# Patient Record
Sex: Male | Born: 1985 | Marital: Single | Smoking: Never smoker
Health system: Southern US, Community
[De-identification: ages and names within clinical notes are randomized; demographics above are authoritative.]

## PROBLEM LIST (undated history)

## (undated) DIAGNOSIS — M542 Cervicalgia: Secondary | ICD-10-CM

## (undated) DIAGNOSIS — L409 Psoriasis, unspecified: Secondary | ICD-10-CM

## (undated) HISTORY — DX: Psoriasis, unspecified: L40.9

## (undated) HISTORY — DX: Cervicalgia: M54.2

---

## 2018-03-11 ENCOUNTER — Ambulatory Visit (INDEPENDENT_AMBULATORY_CARE_PROVIDER_SITE_OTHER): Payer: Self-pay

## 2018-03-11 ENCOUNTER — Encounter: Payer: Self-pay | Admitting: Osteopathic Medicine

## 2018-03-11 ENCOUNTER — Ambulatory Visit (INDEPENDENT_AMBULATORY_CARE_PROVIDER_SITE_OTHER): Payer: Self-pay | Admitting: Osteopathic Medicine

## 2018-03-11 ENCOUNTER — Encounter (INDEPENDENT_AMBULATORY_CARE_PROVIDER_SITE_OTHER): Payer: Self-pay

## 2018-03-11 VITALS — BP 115/77 | HR 61 | Temp 98.1°F | Ht 72.0 in | Wt 161.4 lb

## 2018-03-11 DIAGNOSIS — M542 Cervicalgia: Secondary | ICD-10-CM

## 2018-03-11 DIAGNOSIS — L409 Psoriasis, unspecified: Secondary | ICD-10-CM

## 2018-03-11 DIAGNOSIS — Z8342 Family history of familial hypercholesterolemia: Secondary | ICD-10-CM

## 2018-03-11 DIAGNOSIS — Z0000 Encounter for general adult medical examination without abnormal findings: Secondary | ICD-10-CM

## 2018-03-11 HISTORY — DX: Psoriasis, unspecified: L40.9

## 2018-03-11 HISTORY — DX: Cervicalgia: M54.2

## 2018-03-11 MED ORDER — CLOBETASOL PROPIONATE 0.05 % EX SHAM: 1.0000 "application " | MEDICATED_SHAMPOO | CUTANEOUS | 3 refills | Status: AC

## 2018-03-11 NOTE — Progress Notes (Signed)
HPI: Jose Combs is a 32 y.o. male who  has no past medical Lurline Idolhistory on file.  [facility] today, 03/11/18,  for chief complaint of: New to establish  Pleasant patient here to establish care. I see his girlfriend as well. He works as a Education administratorpainter. He requests general check-up   Patient here for annual physical / wellness exam.  See preventive care reviewed as below.  Recent labs reviewed in detail with the patient.   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 Has XR of lower back awhile ago but never of neck      Past medical, surgical, social and family history reviewed:  Patient Active Problem List   Diagnosis Date Noted  . 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 Family history of high cholesterol 03/11/2018  . Neck pain 03/11/2018   History reviewed. No pertinent surgical history.  Social History   Tobacco Use  . Smoking status: Never Smoker  . Smokeless tobacco: Never Used  Substance Use Topics  . Alcohol use: Yes    Comment: 2 drinks/wk    Family History  Problem Relation Age of Onset  . Heart attack Maternal Grandmother   . Heart attack Maternal Grandfather      Current medication list and allergy/intolerance information reviewed:    No current outpatient medications on file.   No current facility-administered medications for this visit.     No Known Allergies    Review of Systems:  Constitutional:  No  fever, no chills, No recent illness, No unintentional weight changes. No significant fatigue.   HEENT: No  headache, no vision change, no hearing change, No sore throat, No  sinus pressure  Cardiac: No  chest pain, No  pressure, No palpitations, No   Orthopnea  Respiratory:  No  shortness of breath. No  Cough  Gastrointestinal: No  abdominal pain, No  nausea, No  vomiting,  No  blood in stool, No  diarrhea, No  constipation   Musculoskeletal: No new myalgia/arthralgia, +back and neck pain issues   Skin: +Rash, No other wounds/concerning lesions  Genitourinary: No  incontinence, No  abnormal genital bleeding, No abnormal genital discharge  Hem/Onc: No  easy bruising/bleeding, No  abnormal lymph node  Endocrine: No cold intolerance,  No heat intolerance. No polyuria/polydipsia/polyphagia   Neurologic: No  weakness, No  dizziness  Psychiatric: No  concerns with depression, No  concerns with anxiety, No sleep problems, No mood problems  Exam:  BP 115/77 (BP Location: Left Arm, Patient Position: Sitting, Cuff Size: Normal)   Pulse 61   Temp 98.1 F (36.7 C) (Oral)   Ht 6' (1.829 m)   Wt 161 lb 6.4 oz (73.2 kg)   BMI 21.89 kg/m   Constitutional: VS see above. General Appearance: alert, well-developed, well-nourished, NAD  Eyes: Normal lids and conjunctive, non-icteric sclera  Ears, Nose, Mouth, Throat: MMM, Normal external inspection ears/nares/mouth/lips/gums. TM normal bilaterally. Pharynx/tonsils no erythema, no exudate. Nasal mucosa normal.   Neck: No masses, trachea midline. No thyroid enlargement. No tenderness/mass appreciated. No lymphadenopathy  Respiratory: Normal respiratory effort. no wheeze, no rhonchi, no rales  Cardiovascular: S1/S2 normal, no murmur, no rub/gallop auscultated. RRR. No lower extremity edema.  Gastrointestinal: Nontender, no masses. No hepatomegaly, no splenomegaly. No hernia appreciated. Bowel sounds normal. Rectal exam deferred.   Musculoskeletal: Gait normal. No clubbing/cyanosis of digits.   Neurological: Normal balance/coordination. No tremor. No cranial nerve deficit on limited exam. Motor and sensation intact and symmetric. Cerebellar reflexes intact.   Skin: warm, dry, intact. No  rash/ulcer. No concerning nevi or subq nodules on limited exam.    Psychiatric: Normal judgment/insight. Normal mood and affect. Oriented x3.      ASSESSMENT/PLAN:   Annual physical exam - Plan: CBC, COMPLETE METABOLIC PANEL WITH GFR, Lipid panel, HIV antibody  Family history of high cholesterol - Plan: Lipid panel  Scalp psoriasis  Neck pain - Plan: DG Cervical Spine 2 or 3 views   Meds ordered this encounter  Medications  . Clobetasol Propionate 0.05 % shampoo    Sig: Apply 1 application topically 2 (two) times a week.    Dispense:  118 mL    Refill:  3   Orders Placed This Encounter  Procedures  . DG Cervical Spine 2 or 3 views  . CBC  . COMPLETE METABOLIC PANEL WITH GFR  . Lipid panel  . HIV antibody    MALE PREVENTIVE CARE  updated 03/11/18  ANNUAL SCREENING/COUNSELING  Any changes to health in the past year? no  Diet/Exercise - HEALTHY HABITS DISCUSSED TO DECREASE CV RISK Social History   Tobacco Use  Smoking Status Never Smoker  Smokeless Tobacco Never Used   Social History   Substance and Sexual Activity  Alcohol Use Yes   Comment: 2 drinks/wk   Depression screen [facility] 2/9 03/11/2018  Decreased Interest 0  Down, Depressed, Hopeless 0  PHQ - 2 Score 0  Altered sleeping 0  Tired, decreased energy 0  Change in appetite 0  Feeling bad or failure about yourself  0  Trouble concentrating 1  Moving slowly or fidgety/restless 0  Suicidal thoughts 0  PHQ-9 Score 1  Difficult doing work/chores Not difficult at all    SEXUAL/REPRODUCTIVE HEALTH  Sexually active in the past year? - Yes with male.  STI testing needed/desired today? - no  Any concerns with testosterone/libido? - no  INFECTIOUS DISEASE SCREENING  HIV - needs  GC/CT - does not need  HepC - does not need  TB - does not need  CANCER SCREENING  Lung - USPSTF: 55-80yo w/ 30 py hx unless quit w/in 37yr - does not need  Colon - does not need  Prostate - does not  need  OTHER DISEASE SCREENING  Lipid - needs  DM2 - needs  AAA - 65-75yo ever smoked: does not need  Osteoporosis - men 32yo+ - does not need  ADULT VACCINATION  Influenza - annual vaccine recommended  Td - booster every 10 years   Zoster - Shingrix recommended 81+ years old  PCV13 - was not indicated  PPSV23 - was not indicated  There is no immunization history on file for this patient.  OTHER  Fall - exercise and Vit D age 53+ - does not need  Consider ASA - age 74-59 - does not need      Patient Instructions  Try for scalp:   T-Sal shampoo (Salicylic acid)   Tea Tree Oil   Rx steroid shampoo as directed      Visit summary with medication list and pertinent instructions was printed for patient to review. All questions at time of visit were answered - patient instructed to contact office with any additional concerns. ER/RTC  precautions were reviewed with the patient.   Follow-up plan: Return in about 1 year (around 03/12/2019) for annual check-up, sooner if needed .    Please note: voice recognition software was used to produce this document, and typos may escape review. Please contact Dr. Lyn Hollingshead for any needed clarifications.

## 2018-03-11 NOTE — Patient Instructions (Addendum)
Try for scalp:   T-Sal shampoo (Salicylic acid)   Tea Tree Oil   Rx steroid shampoo as directed

## 2018-03-12 LAB — LIPID PANEL
CHOL/HDL RATIO: 3.3 (calc) (ref <5.0)
Cholesterol: 169 mg/dL (ref <200)
HDL: 51 mg/dL (ref >40)
LDL CHOLESTEROL (CALC): 103 mg/dL — AB
Non-HDL Cholesterol (Calc): 118 mg/dL (calc) (ref <130)
Triglycerides: 68 mg/dL (ref <150)

## 2018-03-12 LAB — COMPLETE METABOLIC PANEL WITH GFR
AG Ratio: 2 (calc) (ref 1.0–2.5)
ALBUMIN MSPROF: 5 g/dL (ref 3.6–5.1)
ALT: 16 U/L (ref 9–46)
AST: 20 U/L (ref 10–40)
Alkaline phosphatase (APISO): 74 U/L (ref 40–115)
BUN: 18 mg/dL (ref 7–25)
CALCIUM: 9.9 mg/dL (ref 8.6–10.3)
CO2: 30 mmol/L (ref 20–32)
CREATININE: 0.94 mg/dL (ref 0.60–1.35)
Chloride: 103 mmol/L (ref 98–110)
GFR, EST NON AFRICAN AMERICAN: 108 mL/min/{1.73_m2} (ref >=60)
GFR, Est African American: 125 mL/min/{1.73_m2} (ref >=60)
GLOBULIN: 2.5 g/dL (ref 1.9–3.7)
Glucose, Bld: 89 mg/dL (ref 65–99)
Potassium: 4.3 mmol/L (ref 3.5–5.3)
SODIUM: 139 mmol/L (ref 135–146)
Total Bilirubin: 0.6 mg/dL (ref 0.2–1.2)
Total Protein: 7.5 g/dL (ref 6.1–8.1)

## 2018-03-12 LAB — HIV ANTIBODY (ROUTINE TESTING W REFLEX): HIV 1&2 Ab, 4th Generation: NONREACTIVE

## 2018-03-12 LAB — CBC
HEMATOCRIT: 45.5 % (ref 38.5–50.0)
HEMOGLOBIN: 15.8 g/dL (ref 13.2–17.1)
MCH: 31.7 pg (ref 27.0–33.0)
MCHC: 34.7 g/dL (ref 32.0–36.0)
MCV: 91.2 fL (ref 80.0–100.0)
MPV: 11.2 fL (ref 7.5–12.5)
Platelets: 234 10*3/uL (ref 140–400)
RBC: 4.99 10*6/uL (ref 4.20–5.80)
RDW: 12.5 % (ref 11.0–15.0)
WBC: 5.3 10*3/uL (ref 3.8–10.8)

## 2018-05-03 ENCOUNTER — Telehealth: Payer: Self-pay

## 2018-05-03 MED ORDER — FLUOCINONIDE 0.05 % EX SOLN: 1.0000 "application " | Freq: Two times a day (BID) | CUTANEOUS | 0 refills | Status: DC

## 2018-05-03 NOTE — Telephone Encounter (Signed)
Pt has been updated.  

## 2018-05-03 NOTE — Telephone Encounter (Signed)
Done

## 2018-05-03 NOTE — Telephone Encounter (Signed)
Jose Combs called to give the prescription information to Dr Lyn HollingsheadAlexander. He would like a refill on fluocinonide 0.05 % drops [facility] [address]. Please advise.

## 2018-08-12 ENCOUNTER — Other Ambulatory Visit: Payer: Self-pay | Admitting: Osteopathic Medicine

## 2018-10-25 ENCOUNTER — Other Ambulatory Visit: Payer: Self-pay | Admitting: Osteopathic Medicine

## 2018-12-23 ENCOUNTER — Other Ambulatory Visit: Payer: Self-pay | Admitting: Osteopathic Medicine

## 2019-01-06 IMAGING — DX DG CERVICAL SPINE 2 OR 3 VIEWS
2 series · 2 of 2 positions shown · non-contrast
Comparison: None.

CLINICAL DATA: 31-year-old male woke up a few weeks ago with
right-sided neck pain. Denies injury. Initial encounter.

EXAM:
CERVICAL SPINE - 2-3 VIEW

[c-spine lat]
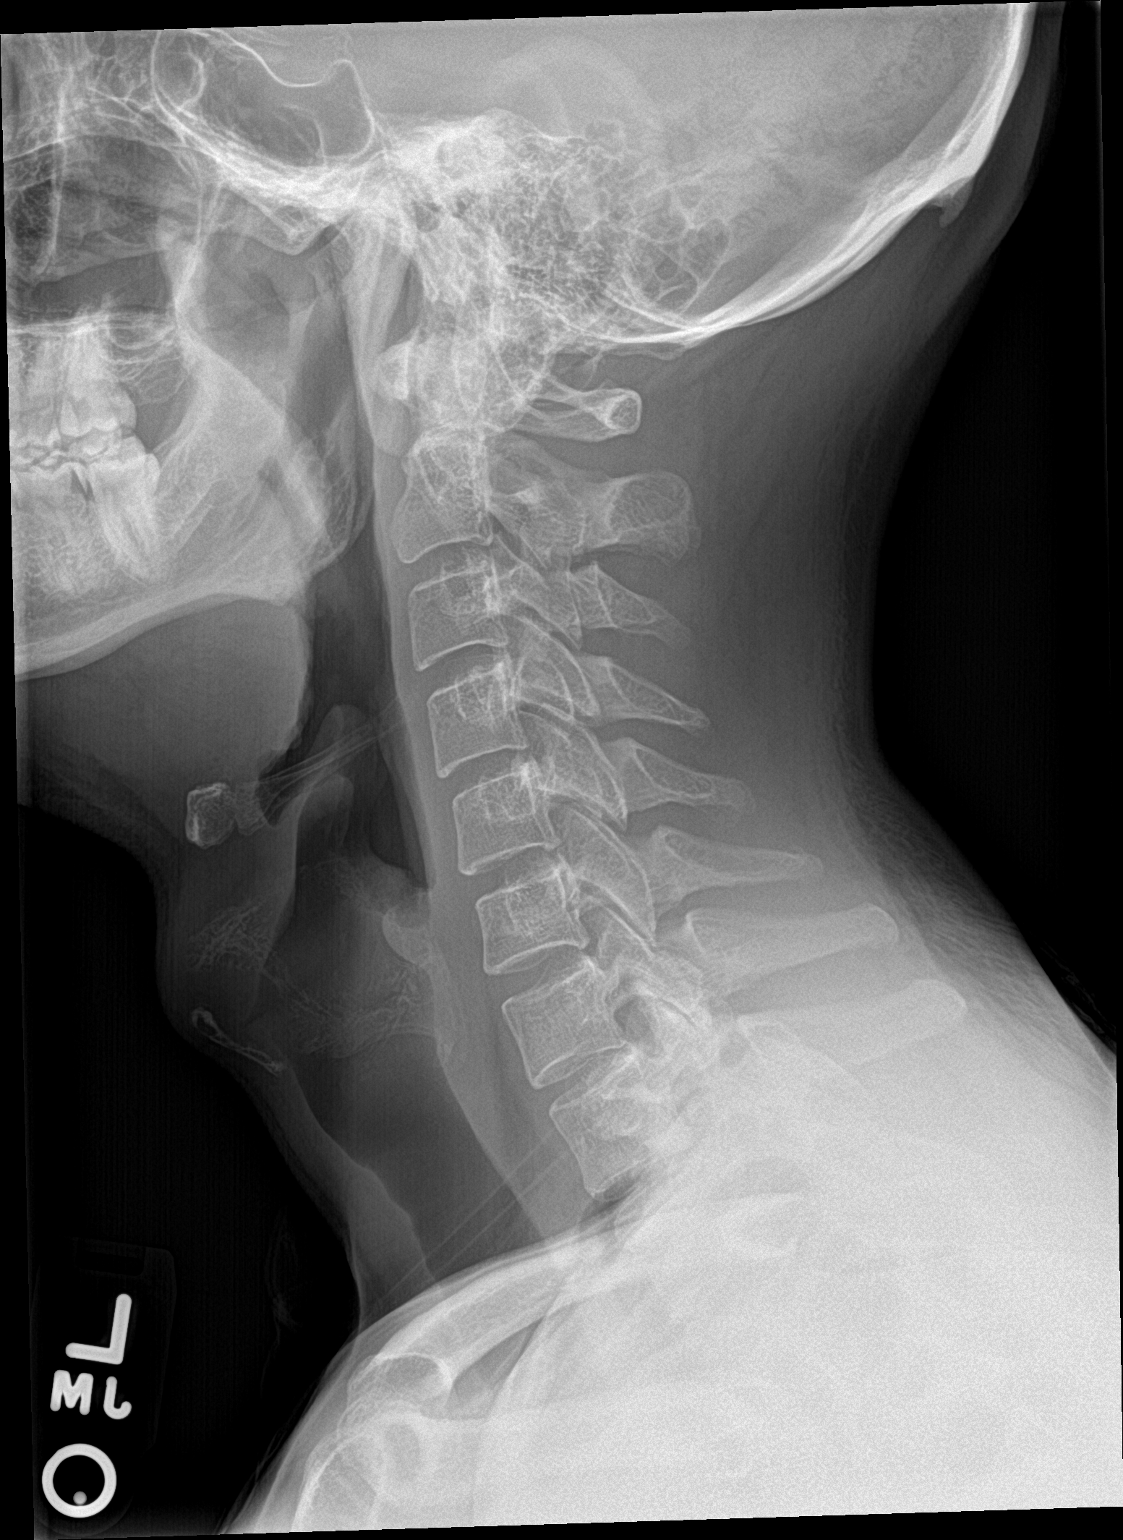

[c-spine ap]
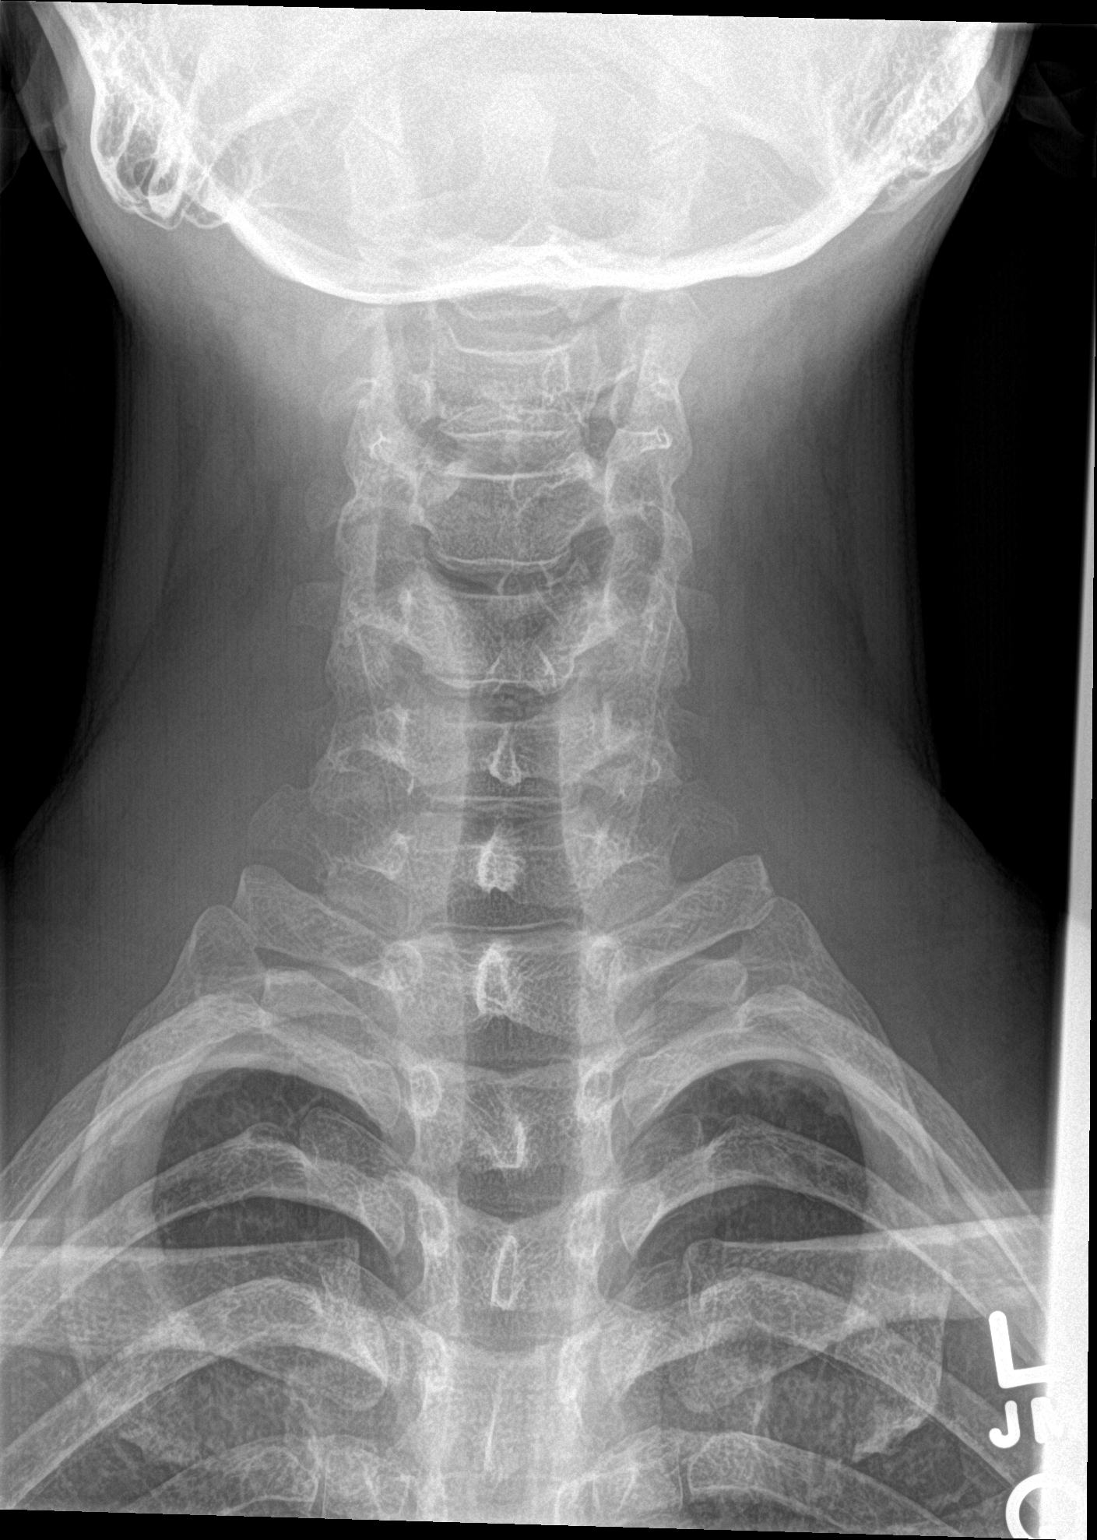

[2 of 2 positions shown; findings below may reference images not displayed]

FINDINGS: Normal alignment without disc space narrowing

Two-view exam without evidence of fracture or abnormal prevertebral
soft tissue swelling.

Scarring lung apices without apical mass identified.
IMPRESSION: Negative cervical spine radiographs.

## 2019-01-27 ENCOUNTER — Other Ambulatory Visit: Payer: Self-pay | Admitting: Osteopathic Medicine

## 2019-04-09 ENCOUNTER — Other Ambulatory Visit: Payer: Self-pay | Admitting: Osteopathic Medicine

## 2019-05-09 ENCOUNTER — Other Ambulatory Visit: Payer: Self-pay | Admitting: Osteopathic Medicine

## 2019-05-13 ENCOUNTER — Other Ambulatory Visit: Payer: Self-pay

## 2019-05-13 MED ORDER — FLUOCINONIDE 0.05 % EX SOLN: Freq: Two times a day (BID) | CUTANEOUS | 0 refills | Status: AC

## 2019-05-13 NOTE — Telephone Encounter (Signed)
Advised patient's girlfriend he needs to schedule a follow up. I sent in 1 refill.

## 2020-09-10 ENCOUNTER — Telehealth: Payer: Self-pay | Admitting: Osteopathic Medicine

## 2020-09-10 NOTE — Telephone Encounter (Signed)
Not been seen since 2019. Needs physical and we can do it at that time

## 2020-09-10 NOTE — Telephone Encounter (Signed)
PT requested a Tdap shot, Wife is pregnant was was advised he needed one.   Please advise.

## 2020-09-14 NOTE — Telephone Encounter (Signed)
Appointment has been made. No further questions at this time.  

## 2020-09-23 ENCOUNTER — Encounter: Payer: Self-pay | Admitting: Osteopathic Medicine

## 2020-09-27 ENCOUNTER — Ambulatory Visit (INDEPENDENT_AMBULATORY_CARE_PROVIDER_SITE_OTHER): Payer: Self-pay | Admitting: Osteopathic Medicine

## 2020-09-27 ENCOUNTER — Encounter: Payer: Self-pay | Admitting: Osteopathic Medicine

## 2020-09-27 VITALS — BP 128/75 | HR 73 | Temp 98.2°F | Wt 159.0 lb

## 2020-09-27 DIAGNOSIS — Z23 Encounter for immunization: Secondary | ICD-10-CM

## 2020-09-27 DIAGNOSIS — Z0000 Encounter for general adult medical examination without abnormal findings: Secondary | ICD-10-CM

## 2020-09-27 NOTE — Patient Instructions (Addendum)
General Preventive Care  Most recent routine screening labs: ordered today.   Blood pressure goal 130/80 or less.   Tobacco: don't!   Alcohol: responsible moderation is ok for most adults - if you have concerns about your alcohol intake, please talk to me!   Exercise: as tolerated to reduce risk of cardiovascular disease and diabetes. Strength training will also prevent osteoporosis.   Mental health: if need for mental health care (medicines, counseling, other), or concerns about moods, please let me know!   Sexual / Reproductive health: if need for STD testing, or if concerns with libido/pain problems, please let me know! If you need to discuss family planning, please let me know!   Advanced Directive: Living Will and/or Healthcare Power of Attorney recommended for all adults, regardless of age or health.  Vaccines  Flu vaccine: for almost everyone, every fall.   Shingles vaccine: after age 31.   Pneumonia vaccines: after age 60.  Tetanus booster: every 10 years. Recommended in 3rd trimester of EVERY pregnancy for the pregnant person, this gives immunity to the baby - household contacts do not need repeat boosters as long as they have had a booster in the past 10 years.   HPV vaccine: Gardasil up to age 37 to prevent HPV-associated diseases, including certain cancers.   COVID vaccine: THANKS for getting your vaccine! :)  Cancer screenings   Colon cancer screening: for everyone age 37-75.   Prostate cancer screening: PSA blood test age 78-71  Lung cancer screening: not needed for non-smokers  Infection screenings   HIV: recommended screening at least once age 54-65, more often as needed.  Gonorrhea/Chlamydia: screening as needed  Hepatitis C: recommended once for everyone age 59-75  TB: certain at-risk populations, or depending on work requirements and/or travel history

## 2020-09-27 NOTE — Progress Notes (Signed)
HPI: Jose Combs is a 34 y.o. male who  has a past medical history of Neck pain (03/11/2018) and Scalp psoriasis (03/11/2018).  he presents to [facility] today, 09/27/20,  for chief complaint of:  Annual physical and requests Tdap vaccine d/t partner is pregnant     ASSESSMENT/PLAN: The primary encounter diagnosis was Annual physical exam. A diagnosis of Need for Tdap vaccination was also pertinent to this visit.   Orders Placed This Encounter  Procedures  . Tdap vaccine greater than or equal to 7yo IM  . CBC  . COMPLETE METABOLIC PANEL WITH GFR  . Lipid panel     No orders of the defined types were placed in this encounter.   Patient Instructions  General Preventive Care  Most recent routine screening labs: ordered today.   Blood pressure goal 130/80 or less.   Tobacco: don't!   Alcohol: responsible moderation is ok for most adults - if you have concerns about your alcohol intake, please talk to me!   Exercise: as tolerated to reduce risk of cardiovascular disease and diabetes. Strength training will also prevent osteoporosis.   Mental health: if need for mental health care (medicines, counseling, other), or concerns about moods, please let me know!   Sexual / Reproductive health: if need for STD testing, or if concerns with libido/pain problems, please let me know! If you need to discuss family planning, please let me know!   Advanced Directive: Living Will and/or Healthcare Power of Attorney recommended for all adults, regardless of age or health.  Vaccines  Flu vaccine: for almost everyone, every fall.   Shingles vaccine: after age 62.   Pneumonia vaccines: after age 46.  Tetanus booster: every 10 years. Recommended in 3rd trimester of EVERY pregnancy for the pregnant person, this gives immunity to the baby - household contacts do not need repeat boosters as long as they have had a booster in the past 10 years.   HPV vaccine:  Gardasil up to age 20 to prevent HPV-associated diseases, including certain cancers.   COVID vaccine: THANKS for getting your vaccine! :)  Cancer screenings   Colon cancer screening: for everyone age 80-75.   Prostate cancer screening: PSA blood test age 21-71  Lung cancer screening: not needed for non-smokers  Infection screenings  . HIV: recommended screening at least once age 59-65, more often as needed. . Gonorrhea/Chlamydia: screening as needed . Hepatitis C: recommended once for everyone age 107-75 . TB: certain at-risk populations, or depending on work requirements and/or travel history     Follow-up plan: Return in about 1 year (around 09/27/2021) for [employer] - SEE [place] SOONER IF NEEDED.                                                 ################################################# ################################################# ################################################# #################################################    Current Meds  Medication Sig  . fluocinonide (LIDEX) 0.05 % external solution Apply topically 2 (two) times daily.    No Known Allergies     Review of Systems: Pertinent (+) and (-) ROS in HPI as above   Exam:  BP 128/75 (BP Location: Left Arm, Patient Position: Sitting, Cuff Size: Normal)   Pulse 73   Temp 98.2 F (36.8 C) (Oral)   Wt 159 lb 0.6 oz (72.1 kg)   BMI 21.57 kg/m   Constitutional:  VS see above. General Appearance: alert, well-developed, well-nourished, NAD  Neck: No masses, trachea midline.   Respiratory: Normal respiratory effort. no wheeze, no rhonchi, no rales  Cardiovascular: S1/S2 normal, no murmur, no rub/gallop auscultated. RRR.   Musculoskeletal: Gait normal. Symmetric and independent movement of all extremities  Abdominal: non-tender, non-distended, no appreciable organomegaly, neg Murphy's, BS [id number]  Neurological: Normal balance/coordination. No  tremor.  Skin: warm, dry, intact.   Psychiatric: Normal judgment/insight. Normal mood and affect. Oriented x3.       Visit summary with medication list and pertinent instructions was printed for patient to review, patient was advised to alert [place] if any updates are needed. All questions at time of visit were answered - patient instructed to contact office with any additional concerns. ER/RTC precautions were reviewed with the patient and understanding verbalized.       Please note: voice recognition software was used to produce this document, and typos may escape review. Please contact Dr. Lyn Hollingshead for any needed clarifications.    Follow up plan: Return in about 1 year (around 09/27/2021) for ANNUAL CHECK-UP - SEE [place] SOONER IF NEEDED.
